# Patient Record
Sex: Female | Born: 2005 | Race: White | Hispanic: No | Marital: Single | Smoking: Never smoker
Health system: Southern US, Community
[De-identification: ages and names within clinical notes are randomized; demographics above are authoritative.]

---
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
## 2023-06-05 ENCOUNTER — Ambulatory Visit (INDEPENDENT_AMBULATORY_CARE_PROVIDER_SITE_OTHER): Payer: BC Managed Care – PPO | Admitting: Adult Health

## 2023-06-05 ENCOUNTER — Encounter: Payer: Self-pay | Admitting: Adult Health

## 2023-06-05 VITALS — BP 126/68 | HR 117 | Temp 98.0°F | Ht 61.0 in | Wt 116.0 lb

## 2023-06-05 DIAGNOSIS — B349 Viral infection, unspecified: Secondary | ICD-10-CM | POA: Diagnosis not present

## 2023-06-05 DIAGNOSIS — R52 Pain, unspecified: Secondary | ICD-10-CM

## 2023-06-05 LAB — POC SOFIA 2 FLU + SARS ANTIGEN FIA
Influenza A, POC: NEGATIVE
Influenza B, POC: NEGATIVE
SARS Coronavirus 2 Ag: NEGATIVE

## 2023-06-05 NOTE — Progress Notes (Signed)
[facility] [address] [place], [place] [address] Phone: [phone] Fax: [phone]   Office Visit Note  Patient Name: Adriana Dunn  Date of Birth:Jan 03, 2006  Med Rec number [id number]  Date of Service: 06/05/2023  Sulfa antibiotics and Latex  Chief Complaint  Patient presents with   Acute Visit     HPI Patient reports for a Dunn days she has been having cold symptoms.  She describes sinus congestion, dry throat, ear pressure, fatigue.  She went to work yesterday, and felt dizzy/tired.  She has taken Dayquil with some improvement.  Denies cough, fever or chills.   Current Medication:  Outpatient Encounter Medications as of 06/05/2023  Medication Sig   etonogestrel (NEXPLANON) 68 MG IMPL implant 1 each by Subdermal route once.   FLUoxetine (PROZAC) 40 MG capsule Take 40 mg by mouth daily.   No facility-administered encounter medications on file as of 06/05/2023.      Medical History: History reviewed. No pertinent past medical history.   Vital Signs: BP 126/68   Pulse (!) 117   Temp 98 F (36.7 C) (Tympanic)   Ht 5\' 1"  (1.549 m)   Wt 116 lb (52.6 kg)   SpO2 97%   BMI 21.92 kg/m    Review of Systems  Constitutional:  Positive for fatigue. Negative for chills and fever.  HENT:  Positive for congestion, ear discharge and sore throat.   Eyes:  Negative for pain and itching.  Respiratory:  Negative for cough.   Cardiovascular:  Negative for chest pain.  Gastrointestinal:  Positive for nausea. Negative for diarrhea and vomiting.    Physical Exam Vitals reviewed.  Constitutional:      Appearance: Normal appearance.  HENT:     Head: Normocephalic.     Right Ear: Tympanic membrane and ear canal normal.     Left Ear: Tympanic membrane and ear canal normal.     Nose: Nose normal.     Mouth/Throat:     Mouth: Mucous membranes are moist.  Eyes:     Pupils: Pupils are equal, round, and reactive to light.  Pulmonary:     Effort: Pulmonary  effort is normal.     Breath sounds: Normal breath sounds.  Lymphadenopathy:     Cervical: No cervical adenopathy.  Neurological:     Mental Status: She is alert.    Results for orders placed or performed in visit on 06/05/23 (from the past 24 hour(s))  POC SOFIA 2 FLU + SARS ANTIGEN FIA     Status: Normal   Collection Time: 06/05/23 10:19 AM  Result Value Ref Range   Influenza A, POC Negative Negative   Influenza B, POC Negative Negative   SARS Coronavirus 2 Ag Negative Negative    Assessment/Plan: 1. Viral illness Rest, and drink plenty of water.  Use cough drops, gargle warm sal water or drink wam liquids (like tea with honey) as needed for cough/throat irritation.   Take over-the-counter medicines (such as Dayquil or Nyquil) as discussed at your visit to help manage your symptoms.  Send a MyChart message to the provider or schedule a return appointment as needed for new/worsening symptoms (especially shortness of breath or chest pain) or if symptoms not improving with recommended treatment over the next 5-7 days.      2. Body aches - POC SOFIA 2 FLU + SARS ANTIGEN FIA     General Counseling: Kerston verbalizes understanding of the findings of todays visit and agrees with plan of treatment. I  have discussed any further diagnostic evaluation that may be needed or ordered today. We also reviewed her medications today. she has been encouraged to call the office with any questions or concerns that should arise related to todays visit.   Orders Placed This Encounter  Procedures   POC SOFIA 2 FLU + SARS ANTIGEN FIA    No orders of the defined types were placed in this encounter.   Time spent:20 Minutes Time spent includes review of chart, medications, test results, and follow up plan with the patient.    Johnna Acosta AGNP-C Nurse Practitioner
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
## 2023-12-11 ENCOUNTER — Encounter: Payer: Self-pay | Admitting: Psychiatry

## 2023-12-11 ENCOUNTER — Ambulatory Visit (INDEPENDENT_AMBULATORY_CARE_PROVIDER_SITE_OTHER): Payer: Self-pay | Admitting: Psychiatry

## 2023-12-11 DIAGNOSIS — G4700 Insomnia, unspecified: Secondary | ICD-10-CM

## 2023-12-11 DIAGNOSIS — F411 Generalized anxiety disorder: Secondary | ICD-10-CM | POA: Diagnosis not present

## 2023-12-11 DIAGNOSIS — F331 Major depressive disorder, recurrent, moderate: Secondary | ICD-10-CM

## 2023-12-11 MED ORDER — TRAZODONE HCL 50 MG PO TABS: 25.0000 mg | ORAL_TABLET | Freq: Every evening | ORAL | 1 refills | Status: DC | PRN

## 2024-01-11 ENCOUNTER — Other Ambulatory Visit: Payer: Self-pay | Admitting: Psychiatry
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
## 2024-01-20 ENCOUNTER — Telehealth (INDEPENDENT_AMBULATORY_CARE_PROVIDER_SITE_OTHER): Admitting: Psychiatry

## 2024-01-20 ENCOUNTER — Encounter: Payer: Self-pay | Admitting: Psychiatry

## 2024-01-20 DIAGNOSIS — F330 Major depressive disorder, recurrent, mild: Secondary | ICD-10-CM

## 2024-01-20 DIAGNOSIS — G4700 Insomnia, unspecified: Secondary | ICD-10-CM

## 2024-01-20 DIAGNOSIS — F411 Generalized anxiety disorder: Secondary | ICD-10-CM | POA: Diagnosis not present

## 2024-01-20 MED ORDER — FLUOXETINE HCL 20 MG PO CAPS: 20.0000 mg | ORAL_CAPSULE | Freq: Every day | ORAL | 1 refills | Status: DC

## 2024-01-20 MED ORDER — FLUOXETINE HCL 40 MG PO CAPS: 40.0000 mg | ORAL_CAPSULE | Freq: Every day | ORAL | 1 refills | Status: DC

## 2024-01-20 NOTE — Patient Instructions (Addendum)
 Increase fluoxetine 60 mg daily  Hold trazodone   Next appointment- 7/29 at 11 am

## 2024-02-20 ENCOUNTER — Other Ambulatory Visit: Payer: Self-pay | Admitting: Psychiatry
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
## 2024-02-25 ENCOUNTER — Encounter: Payer: Self-pay | Admitting: Psychiatry

## 2024-02-25 ENCOUNTER — Ambulatory Visit: Admitting: Psychiatry

## 2024-02-25 VITALS — BP 122/82 | HR 100 | Temp 98.8°F | Ht 61.0 in | Wt 121.6 lb

## 2024-02-25 DIAGNOSIS — G4700 Insomnia, unspecified: Secondary | ICD-10-CM | POA: Diagnosis not present

## 2024-02-25 DIAGNOSIS — F411 Generalized anxiety disorder: Secondary | ICD-10-CM | POA: Diagnosis not present

## 2024-02-25 DIAGNOSIS — F330 Major depressive disorder, recurrent, mild: Secondary | ICD-10-CM | POA: Diagnosis not present

## 2024-02-25 MED ORDER — ARIPIPRAZOLE 2 MG PO TABS
2.0000 mg | ORAL_TABLET | Freq: Every day | ORAL | 1 refills | Status: DC
Start: 2024-02-25 — End: 2024-04-14

## 2024-02-25 NOTE — Patient Instructions (Signed)
 Decrease fluoxetine  40 mg daily  Start Abilify  2 mg at night  Obtain lab (TSH) at [facility] Next appointment- 9/11 at 10 am

## 2024-03-11 NOTE — Progress Notes (Signed)
 History of Present Illness:   Adriana Dunn is a 18 y.o. female here for injury to her right middle finger.  She states that she slammed in the car door 2 days ago.  She presents with pain at the distal phalanx of the finger.  She is right-hand dominant.  She reports a throbbing pain.  She suffered a subungual hematoma of the nail.  Nail is still intact.  She denies any other injury at this time.  She is requesting something for pain.  Will proceed with Toradol injection and x-ray.   Past Medical History:   Past Medical History:  Diagnosis Date  . ADHD   . Anxiety   . Depression   . 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 Panic attacks     Past Surgical History:   Past Surgical History:  Procedure Laterality Date  . nexplanon placement Left     Allergies:   Allergies  Allergen Reactions  . Sulfa (Sulfonamide Antibiotics) Abdominal Pain and Other (See Comments)    GI system upset  Stomach Ache  . Latex Rash    Current Medications:   Prior to Admission medications  Medication Sig Taking? Last Dose  ARIPiprazole  (ABILIFY ) 2 MG tablet Take 2 mg by mouth at bedtime Yes Taking  etonogestreL (NEXPLANON) 68 mg implant Inject 1 each into the skin once Yes Taking  FLUoxetine  (PROZAC ) 40 MG capsule Take 40 mg by mouth once daily Yes Taking  cefdinir (OMNICEF) 300 mg capsule Take 1 capsule (300 mg total) by mouth 2 (two) times daily for 7 days      Family History:  No family history on file.  Social History:   Social History   Socioeconomic History  . Marital status: Single  Tobacco Use  . Smoking status: Never  . Smokeless tobacco: Never  Vaping Use  . Vaping status: Never Used  Substance and Sexual Activity  . Alcohol use: Never  . Drug use: Never  . Sexual activity: Yes    Partners: Male, Female    Birth control/protection: Implant    Comment: trans female is her partner   Social Drivers of Health   Housing Stability: Unknown  (01/10/2024)   Housing Stability Vital Sign   . Homeless in the Last Year: No    Review of Systems:   Review of Systems  Musculoskeletal:  Positive for arthralgias. Negative for gait problem and joint swelling.  Skin:  Positive for wound.  Neurological:  Negative for weakness and numbness.  All other systems reviewed and are negative.   Vitals:   Vitals:   03/11/24 0806  BP: 127/76  Pulse: 96  Temp: 36.9 C (98.5 F)  TempSrc: Oral  SpO2: 97%  Weight: 56.6 kg (124 lb 12.8 oz)  Height: 154.9 cm (5' 1)     Body mass index is 23.58 kg/m.  Physical Exam:   Physical Exam Vitals and nursing note reviewed.  Constitutional:      Appearance: She is well-developed.  HENT:     Head: Atraumatic.  Eyes:     Conjunctiva/sclera: Conjunctivae normal.     Pupils: Pupils are equal, round, and reactive to light.  Cardiovascular:     Rate and Rhythm: Normal rate and regular rhythm.     Heart sounds: Normal heart sounds.  Pulmonary:     Effort: Pulmonary effort is normal.     Breath sounds: Normal breath sounds.  Abdominal:     Comments: Gastrointestinal system examined as above.  Musculoskeletal:     Right hand: Tenderness and bony tenderness present. Normal strength. Normal sensation.       Arms:     Comments: RIGHT middle finger: subungual hematoma  Skin:    General: Skin is warm and dry.  Neurological:     Mental Status: She is alert and oriented to person, place, and time.  Psychiatric:        Behavior: Behavior normal.     Assessment and Plan:  No results found for this visit on 03/11/24.  Diagnoses and all orders for this visit:  Injury of right middle finger, initial encounter -     X-ray finger right minimum 2 views -     ketorolac (TORADOL) injection 60 mg  Subungual hematoma of finger, initial encounter  Other orders -     cefdinir (OMNICEF) 300 mg capsule; Take 1 capsule (300 mg total) by mouth 2 (two) times daily for 7 days  NOTE: xray reviewed by  me; no acute abnormality noted on my review; await official report  Procedure note: RIGHT middle fingernail cleaned with 2 Chlor-hexidine swabs; subungual hematoma trephinated with cautery pen, with return of blood, and relief of pain. Patient tolerated procedure well.   Patient Instructions  As we discussed, I did not see any abnormality on your x-rays.  A radiologist will still review your x-rays.  We will call you with these official results once finalized, ONLY IF DIFFERENT FROM MY REVIEW. Otherwise, we will not call you. For now, wear the finger splint with all activity until symptoms resolve.  You do not have to sleep in it unless it provides comfort.    Keep the finger wound clean, dry, and covered.  Your body will reabsorb the blood, and the pain should resolve as this occurs.  Take medications as directed. Return to care should your symptoms not improve, or please present to the nearest Emergency Department should your symptoms change or worsen in any way.  For pain, take your daily maximal doses of Tylenol (acetaminophen) and Motrin/Advil (ibuprofen). You may alternate these every 4 hours. For example, take Tylenol 1000 mg at 8 am, then Motrin 800 mg at noon, then Tylenol 1000 mg at 4 pm, then Motrin at 8 pm, etc.     Portions of this note were created using dictation software and may contain typographical errors.   Patient received an After Visit Summary

## 2024-03-31 ENCOUNTER — Other Ambulatory Visit: Payer: Self-pay | Admitting: Psychiatry

## 2024-04-03 NOTE — Progress Notes (Unsigned)
 No show

## 2024-04-09 ENCOUNTER — Ambulatory Visit (INDEPENDENT_AMBULATORY_CARE_PROVIDER_SITE_OTHER): Admitting: Psychiatry

## 2024-04-09 DIAGNOSIS — Z91199 Patient's noncompliance with other medical treatment and regimen due to unspecified reason: Secondary | ICD-10-CM
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
## 2024-04-14 ENCOUNTER — Ambulatory Visit: Admitting: Psychiatry

## 2024-04-14 ENCOUNTER — Encounter: Payer: Self-pay | Admitting: Psychiatry

## 2024-04-14 ENCOUNTER — Other Ambulatory Visit: Payer: Self-pay

## 2024-04-14 ENCOUNTER — Other Ambulatory Visit: Payer: Self-pay | Admitting: Psychiatry

## 2024-04-14 VITALS — BP 112/73 | HR 102 | Temp 97.5°F | Ht 61.0 in | Wt 126.8 lb

## 2024-04-14 DIAGNOSIS — E611 Iron deficiency: Secondary | ICD-10-CM | POA: Diagnosis not present

## 2024-04-14 DIAGNOSIS — F330 Major depressive disorder, recurrent, mild: Secondary | ICD-10-CM

## 2024-04-14 DIAGNOSIS — F411 Generalized anxiety disorder: Secondary | ICD-10-CM

## 2024-04-14 DIAGNOSIS — Z1321 Encounter for screening for nutritional disorder: Secondary | ICD-10-CM

## 2024-04-14 DIAGNOSIS — G4700 Insomnia, unspecified: Secondary | ICD-10-CM

## 2024-04-14 MED ORDER — ARIPIPRAZOLE 2 MG PO TABS: 2.0000 mg | ORAL_TABLET | Freq: Every day | ORAL | 1 refills | Status: DC

## 2024-04-14 MED ORDER — FLUOXETINE HCL 10 MG PO CAPS: 10.0000 mg | ORAL_CAPSULE | Freq: Every day | ORAL | 3 refills | Status: DC

## 2024-04-14 MED ORDER — FLUOXETINE HCL 40 MG PO CAPS: 40.0000 mg | ORAL_CAPSULE | Freq: Every day | ORAL | 3 refills | Status: DC

## 2024-04-14 NOTE — Patient Instructions (Addendum)
 Increase fluoxetine  50 mg daily  Continue Abilify  2 mg at night  Obtain lab (TSH, vitamin D, CBC, ferritin) at [facility] Next appointment- 11/11 at 3 pm

## 2024-05-11 ENCOUNTER — Other Ambulatory Visit: Payer: Self-pay | Admitting: Psychiatry

## 2024-05-20 ENCOUNTER — Other Ambulatory Visit: Payer: Self-pay | Admitting: Psychiatry
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
## 2024-06-09 ENCOUNTER — Ambulatory Visit: Admitting: Psychiatry

## 2024-06-09 ENCOUNTER — Encounter: Payer: Self-pay | Admitting: Psychiatry

## 2024-06-09 VITALS — BP 118/72 | HR 99 | Temp 98.3°F | Ht 61.25 in | Wt 133.0 lb

## 2024-06-09 DIAGNOSIS — F3341 Major depressive disorder, recurrent, in partial remission: Secondary | ICD-10-CM | POA: Diagnosis not present

## 2024-06-09 DIAGNOSIS — Z79899 Other long term (current) drug therapy: Secondary | ICD-10-CM

## 2024-06-09 DIAGNOSIS — F411 Generalized anxiety disorder: Secondary | ICD-10-CM

## 2024-06-09 DIAGNOSIS — R41840 Attention and concentration deficit: Secondary | ICD-10-CM

## 2024-06-09 MED ORDER — ARIPIPRAZOLE 2 MG PO TABS: 2.0000 mg | ORAL_TABLET | Freq: Every day | ORAL | 0 refills | Status: AC

## 2024-06-09 MED ORDER — ATOMOXETINE HCL 25 MG PO CAPS: 25.0000 mg | ORAL_CAPSULE | Freq: Every day | ORAL | 1 refills | Status: DC

## 2024-06-09 NOTE — Patient Instructions (Addendum)
 Continue Fluoxetine  50 mg daily  Continue Abilify  2 mg at night  Start Strattera 25 mg daily  Obtain lab (TSH, vitamin D, CBC, ferritin) at [facility] Next appointment- 12/29 at 4:30

## 2024-07-05 ENCOUNTER — Other Ambulatory Visit: Payer: Self-pay | Admitting: Psychiatry
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
## 2024-07-27 ENCOUNTER — Other Ambulatory Visit: Payer: Self-pay

## 2024-07-27 ENCOUNTER — Encounter: Payer: Self-pay | Admitting: Psychiatry

## 2024-07-27 ENCOUNTER — Ambulatory Visit: Admitting: Psychiatry

## 2024-07-27 VITALS — BP 115/80 | HR 99 | Temp 97.2°F | Ht 61.25 in | Wt 140.4 lb

## 2024-07-27 DIAGNOSIS — F3341 Major depressive disorder, recurrent, in partial remission: Secondary | ICD-10-CM | POA: Diagnosis not present

## 2024-07-27 DIAGNOSIS — R41840 Attention and concentration deficit: Secondary | ICD-10-CM | POA: Diagnosis not present

## 2024-07-27 DIAGNOSIS — F411 Generalized anxiety disorder: Secondary | ICD-10-CM | POA: Diagnosis not present

## 2024-07-27 MED ORDER — ATOMOXETINE HCL 25 MG PO CAPS: 25.0000 mg | ORAL_CAPSULE | Freq: Every day | ORAL | 0 refills | Status: AC

## 2024-07-27 MED ORDER — FLUOXETINE HCL 10 MG PO CAPS: 10.0000 mg | ORAL_CAPSULE | Freq: Every day | ORAL | 1 refills | Status: AC

## 2024-07-27 MED ORDER — FLUOXETINE HCL 40 MG PO CAPS: 40.0000 mg | ORAL_CAPSULE | Freq: Every day | ORAL | 1 refills | Status: AC

## 2024-07-27 MED ORDER — METFORMIN HCL 500 MG PO TABS: 500.0000 mg | ORAL_TABLET | Freq: Every day | ORAL | 1 refills | Status: DC

## 2024-08-28 ENCOUNTER — Other Ambulatory Visit: Payer: Self-pay | Admitting: Psychiatry
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
## 2024-09-08 ENCOUNTER — Ambulatory Visit: Admitting: Psychiatry
